# Patient Record
Sex: Male | Born: 1940 | Race: White | Hispanic: No | Smoking: Never smoker
Health system: Southern US, Community
[De-identification: ages and names within clinical notes are randomized; demographics above are authoritative.]

## PROBLEM LIST (undated history)

## (undated) DIAGNOSIS — K5790 Diverticulosis of intestine, part unspecified, without perforation or abscess without bleeding: Secondary | ICD-10-CM

## (undated) DIAGNOSIS — K209 Esophagitis, unspecified without bleeding: Secondary | ICD-10-CM

## (undated) DIAGNOSIS — Z860100 Personal history of colon polyps, unspecified: Secondary | ICD-10-CM

## (undated) DIAGNOSIS — C439 Malignant melanoma of skin, unspecified: Secondary | ICD-10-CM

## (undated) DIAGNOSIS — E0590 Thyrotoxicosis, unspecified without thyrotoxic crisis or storm: Secondary | ICD-10-CM

## (undated) DIAGNOSIS — I10 Essential (primary) hypertension: Secondary | ICD-10-CM

## (undated) DIAGNOSIS — E039 Hypothyroidism, unspecified: Secondary | ICD-10-CM

## (undated) DIAGNOSIS — D126 Benign neoplasm of colon, unspecified: Secondary | ICD-10-CM

## (undated) DIAGNOSIS — K210 Gastro-esophageal reflux disease with esophagitis: Secondary | ICD-10-CM

## (undated) HISTORY — PX: COLON SURGERY: SHX602

## (undated) HISTORY — PX: NASAL/SINUS ENDOSCOPY: SHX288

## (undated) HISTORY — PX: OTHER SURGICAL HISTORY: SHX169

---

## 2004-12-13 ENCOUNTER — Ambulatory Visit: Payer: Self-pay | Admitting: Internal Medicine

## 2004-12-17 ENCOUNTER — Ambulatory Visit: Payer: Self-pay | Admitting: Oncology

## 2005-01-03 ENCOUNTER — Ambulatory Visit: Payer: Self-pay | Admitting: Oncology

## 2005-01-06 ENCOUNTER — Ambulatory Visit: Payer: Self-pay | Admitting: Oncology

## 2005-01-31 ENCOUNTER — Ambulatory Visit: Payer: Self-pay | Admitting: Oncology

## 2005-04-13 ENCOUNTER — Ambulatory Visit: Payer: Self-pay | Admitting: Otolaryngology

## 2006-10-09 ENCOUNTER — Ambulatory Visit: Payer: Self-pay | Admitting: Ophthalmology

## 2006-10-18 ENCOUNTER — Ambulatory Visit: Payer: Self-pay | Admitting: Ophthalmology

## 2007-02-13 ENCOUNTER — Ambulatory Visit: Payer: Self-pay | Admitting: Internal Medicine

## 2007-02-20 ENCOUNTER — Ambulatory Visit: Payer: Self-pay

## 2007-03-28 ENCOUNTER — Ambulatory Visit: Payer: Self-pay | Admitting: Unknown Physician Specialty

## 2007-03-28 HISTORY — PX: ESOPHAGOGASTRODUODENOSCOPY: SHX1529

## 2008-01-30 IMAGING — US US THYROID
1 series · 17 of 23 positions shown · non-contrast
Comparison: none

REASON FOR EXAM: goiter
COMMENTS:

[Series 1: us thyroid · 17 of 23 slices shown]
[im 1/23]
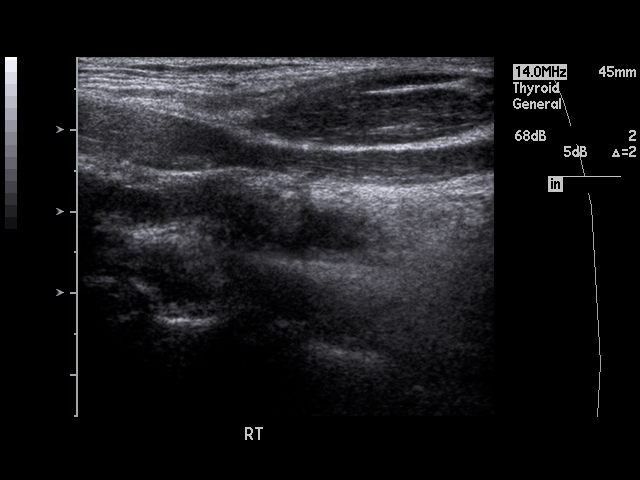
[im 3/23]
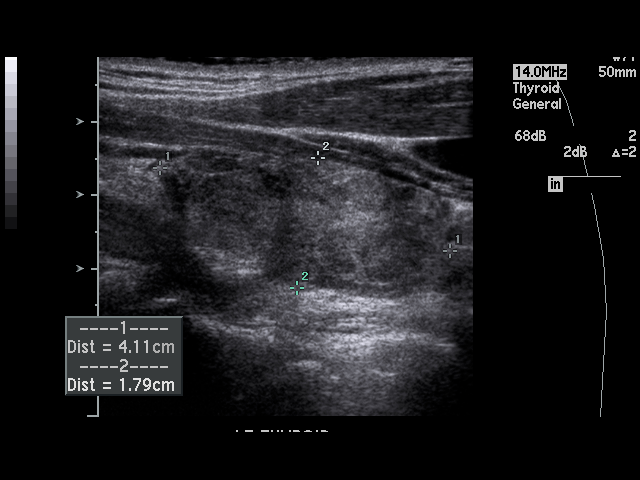
[im 4/23]
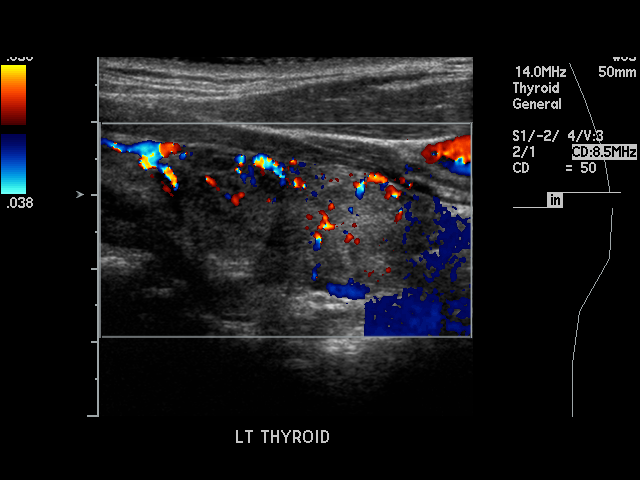
[im 5/23]
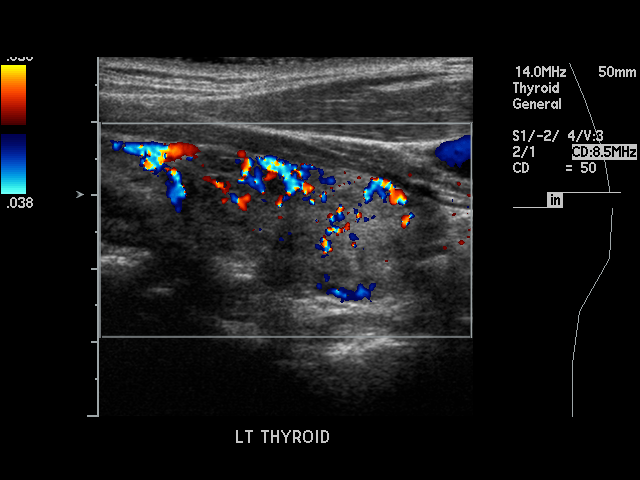
[im 7/23]
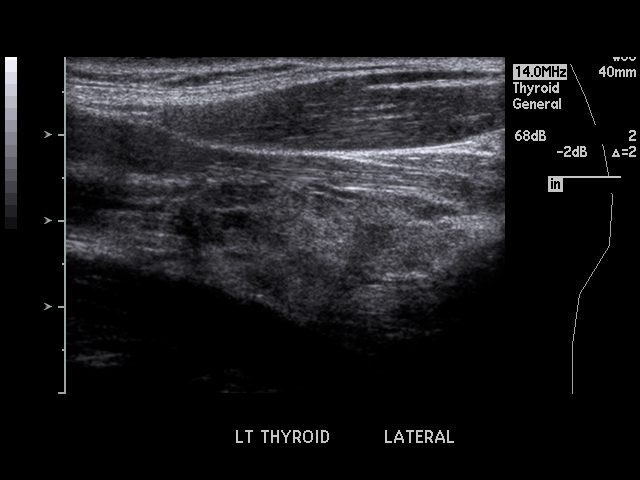
[im 8/23]
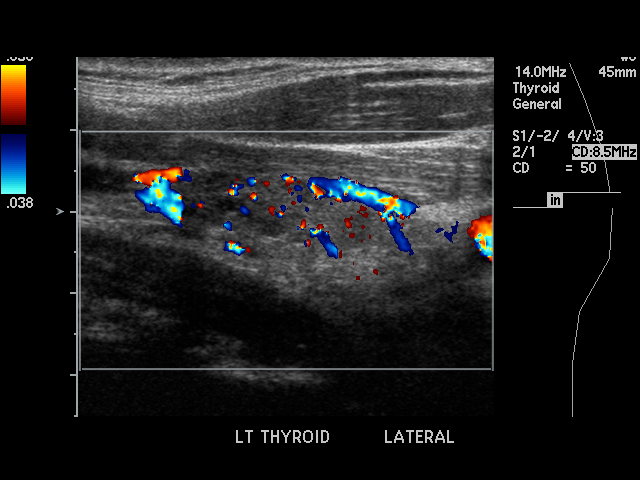
[im 9/23]
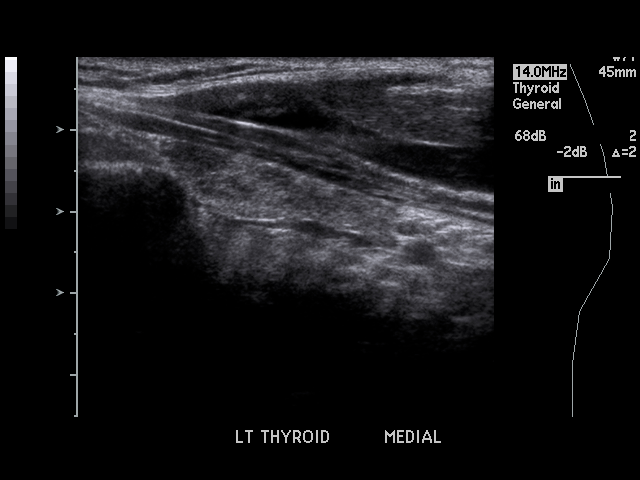
[im 11/23]
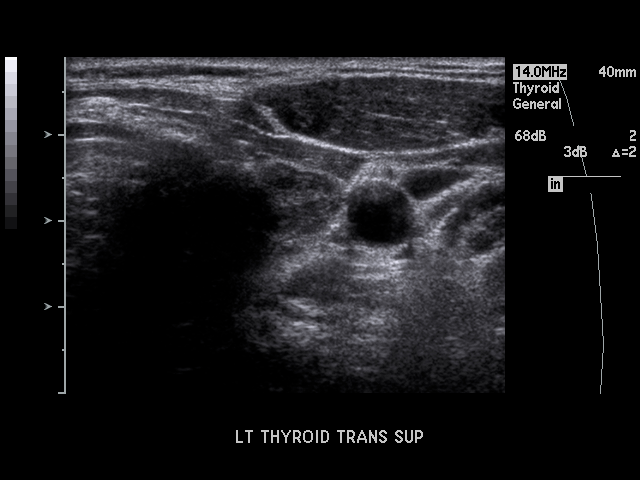
[im 12/23]
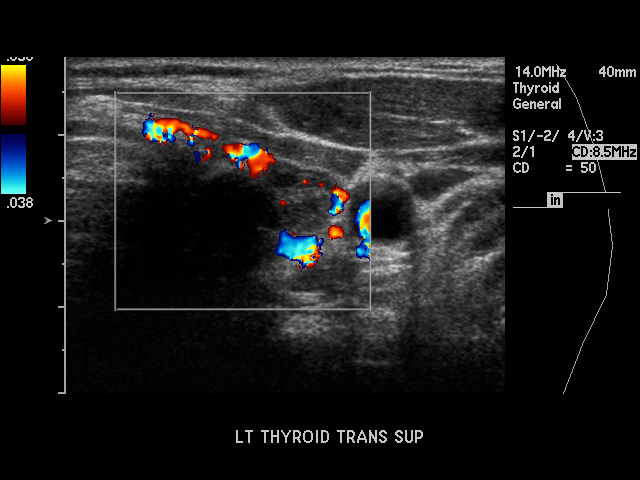
[im 13/23]
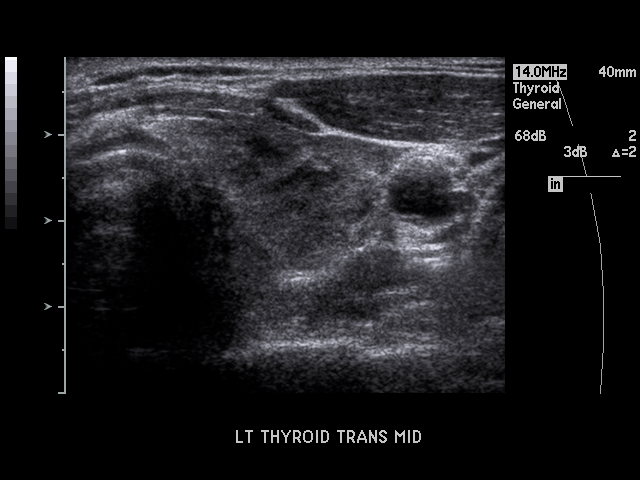
[im 15/23]
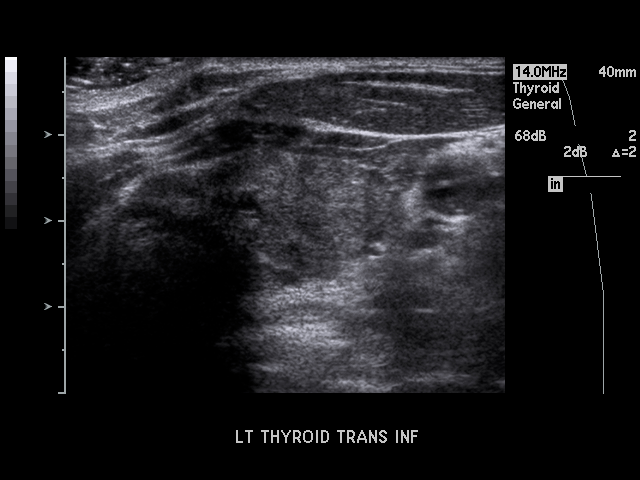
[im 16/23]
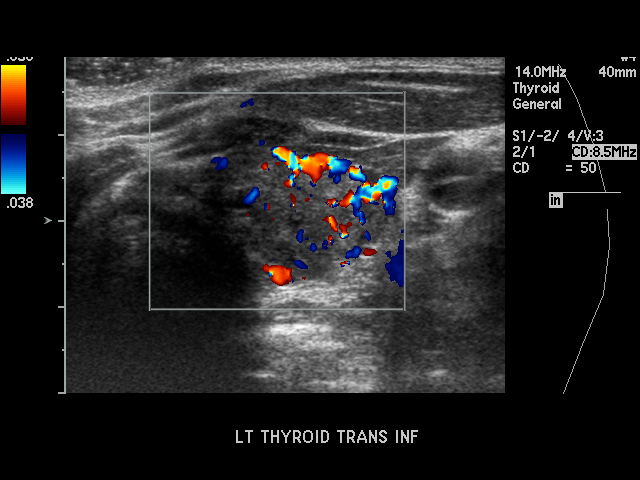
[im 17/23]
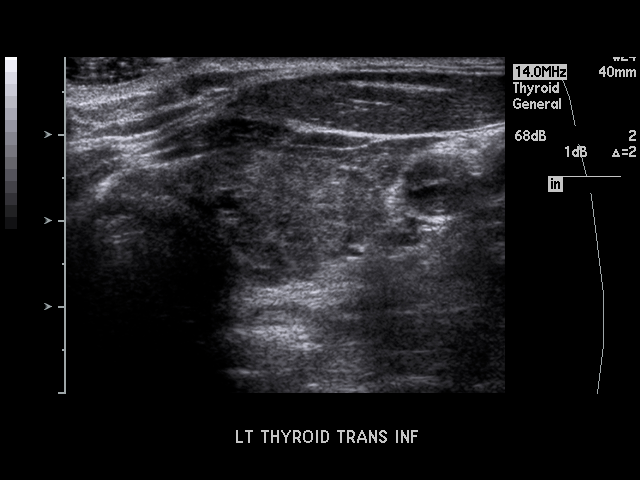
[im 19/23]
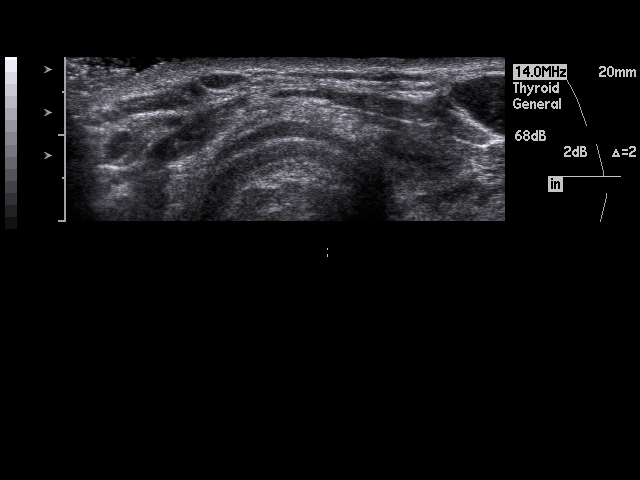
[im 20/23]
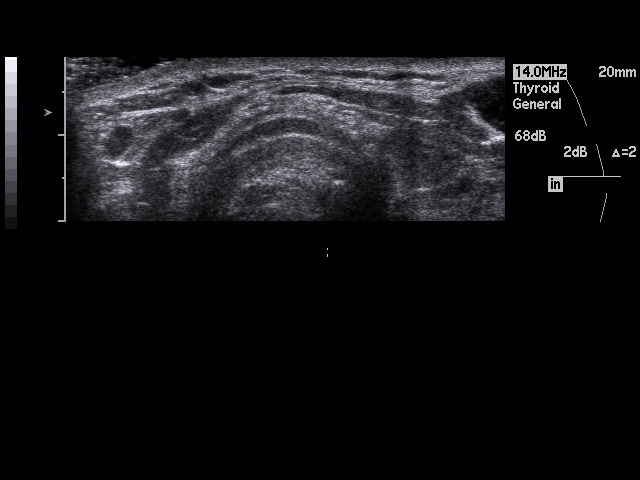
[im 21/23]
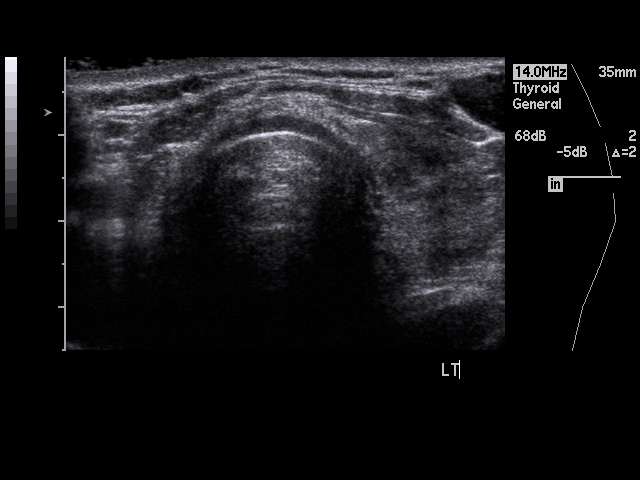
[im 23/23]
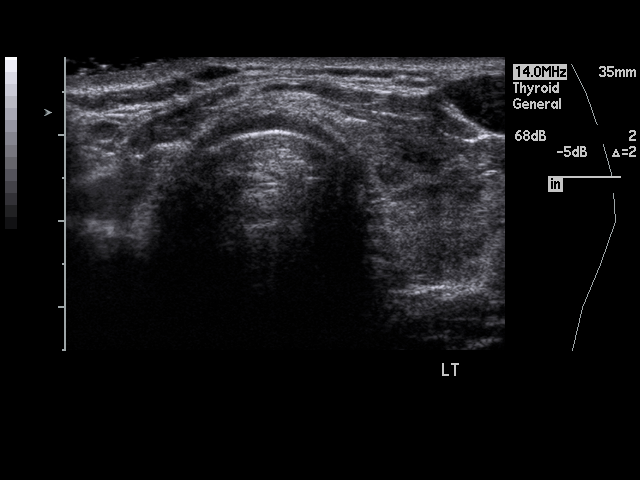

[17 of 23 positions shown; findings below may reference images not displayed]

PROCEDURE:     US  - US THYROID  - February 13, 2007 [DATE]

RESULT:     The RIGHT thyroid lobe is not visualized compatible with prior
surgical removal. The LEFT lobe measures 4.11 cm x 1.79 cm x 2.07 cm. No
solid or cystic mass lesions are seen in the LEFT lobe. There is a
homogeneous echotexture to the LEFT lobe and isthmus.
IMPRESSION: 1. The LEFT lobe of the thyroid is normal appearance sonographically.
2. The RIGHT thyroid lobe is not seen compatible with prior surgical removal.

## 2010-06-02 ENCOUNTER — Ambulatory Visit: Payer: Self-pay | Admitting: Unknown Physician Specialty

## 2014-10-09 DIAGNOSIS — E039 Hypothyroidism, unspecified: Secondary | ICD-10-CM

## 2014-10-09 DIAGNOSIS — K2100 Gastro-esophageal reflux disease with esophagitis, without bleeding: Secondary | ICD-10-CM

## 2014-10-09 HISTORY — DX: Hypothyroidism, unspecified: E03.9

## 2014-10-09 HISTORY — DX: Gastro-esophageal reflux disease with esophagitis, without bleeding: K21.00

## 2015-04-24 ENCOUNTER — Encounter: Payer: Self-pay | Admitting: *Deleted

## 2015-04-27 ENCOUNTER — Ambulatory Visit: Payer: Medicare Other | Admitting: Anesthesiology

## 2015-04-27 ENCOUNTER — Encounter
Admission: RE | Disposition: A | Discharge status: Home / Self Care | Payer: Self-pay | Source: Ambulatory Visit | Attending: Unknown Physician Specialty

## 2015-04-27 ENCOUNTER — Encounter: Payer: Self-pay | Admitting: *Deleted

## 2015-04-27 ENCOUNTER — Ambulatory Visit
Admission: RE | Admit: 2015-04-27 | Discharge: 2015-04-27 | Disposition: A | Discharge status: Home / Self Care | Payer: Medicare Other | Source: Ambulatory Visit | Attending: Unknown Physician Specialty | Admitting: Unknown Physician Specialty

## 2015-04-27 DIAGNOSIS — E039 Hypothyroidism, unspecified: Secondary | ICD-10-CM | POA: Insufficient documentation

## 2015-04-27 DIAGNOSIS — K5730 Diverticulosis of large intestine without perforation or abscess without bleeding: Secondary | ICD-10-CM | POA: Diagnosis not present

## 2015-04-27 DIAGNOSIS — K648 Other hemorrhoids: Secondary | ICD-10-CM | POA: Insufficient documentation

## 2015-04-27 DIAGNOSIS — K210 Gastro-esophageal reflux disease with esophagitis: Secondary | ICD-10-CM | POA: Insufficient documentation

## 2015-04-27 DIAGNOSIS — D122 Benign neoplasm of ascending colon: Secondary | ICD-10-CM | POA: Diagnosis present

## 2015-04-27 DIAGNOSIS — I10 Essential (primary) hypertension: Secondary | ICD-10-CM | POA: Diagnosis not present

## 2015-04-27 DIAGNOSIS — D123 Benign neoplasm of transverse colon: Secondary | ICD-10-CM | POA: Insufficient documentation

## 2015-04-27 HISTORY — DX: Essential (primary) hypertension: I10

## 2015-04-27 HISTORY — DX: Gastro-esophageal reflux disease with esophagitis: K21.0

## 2015-04-27 HISTORY — DX: Malignant melanoma of skin, unspecified: C43.9

## 2015-04-27 HISTORY — PX: COLONOSCOPY: SHX174

## 2015-04-27 HISTORY — PX: COLONOSCOPY: SHX5424

## 2015-04-27 HISTORY — DX: Hypothyroidism, unspecified: E03.9

## 2015-04-27 SURGERY — COLONOSCOPY
Anesthesia: General

## 2015-04-27 MED ORDER — FENTANYL CITRATE (PF) 100 MCG/2ML IJ SOLN
INTRAMUSCULAR | Status: DC | PRN
  Administered 2015-04-27: 50 ug via INTRAVENOUS

## 2015-04-27 MED ORDER — MIDAZOLAM HCL 2 MG/2ML IJ SOLN
INTRAMUSCULAR | Status: DC | PRN
  Administered 2015-04-27: 1 mg via INTRAVENOUS

## 2015-04-27 MED ORDER — SODIUM CHLORIDE 0.9 % IV SOLN: INTRAVENOUS | Status: DC

## 2015-04-27 MED ORDER — PROPOFOL INFUSION 10 MG/ML OPTIME
INTRAVENOUS | Status: DC | PRN
  Administered 2015-04-27: 120 ug/kg/min via INTRAVENOUS

## 2015-04-27 MED ORDER — LACTATED RINGERS IV SOLN
INTRAVENOUS | Status: DC
  Administered 2015-04-27: 13:00:00 via INTRAVENOUS

## 2015-04-27 NOTE — H&P (Signed)
Primary Care Physician:  Rusty Aus., MD Primary Gastroenterologist:  Dr. Vira Agar  Pre-Procedure History & Physical: HPI:  Peter Rosario is a 74 y.o. male is here for an colonoscopy.   Past Medical History  Diagnosis Date  . Hypertension   . Hypothyroidism   . Esophagitis, reflux   . Melanoma     Past Surgical History  Procedure Laterality Date  . Nasal/sinus endoscopy    . Colon surgery    . Goiter      LARGE THYROID GOITER REMOVED    Prior to Admission medications   Medication Sig Start Date End Date Taking? Authorizing Provider  albuterol (PROVENTIL HFA;VENTOLIN HFA) 108 (90 BASE) MCG/ACT inhaler Inhale 2 puffs into the lungs every 6 (six) hours as needed for wheezing or shortness of breath.   Yes Historical Provider, MD  aspirin 81 MG tablet Take 81 mg by mouth daily.   Yes Historical Provider, MD  levothyroxine (SYNTHROID, LEVOTHROID) 137 MCG tablet Take 137 mcg by mouth daily before breakfast.   Yes Historical Provider, MD  levothyroxine (SYNTHROID, LEVOTHROID) 150 MCG tablet Take 150 mcg by mouth daily before breakfast.   Yes Historical Provider, MD  lisinopril-hydrochlorothiazide (PRINZIDE,ZESTORETIC) 20-12.5 MG per tablet Take 1 tablet by mouth daily.   Yes Historical Provider, MD  Multiple Vitamin (MULTIVITAMIN) tablet Take 1 tablet by mouth daily.   Yes Historical Provider, MD  pantoprazole (PROTONIX) 40 MG tablet Take 40 mg by mouth daily.   Yes Historical Provider, MD  vitamin C (ASCORBIC ACID) 500 MG tablet Take 500 mg by mouth 2 (two) times daily.   Yes Historical Provider, MD    Allergies as of 01/20/2015  . (Not on File)    History reviewed. No pertinent family history.  History   Social History  . Marital Status: Married    Spouse Name: N/A  . Number of Children: N/A  . Years of Education: N/A   Occupational History  . Not on file.   Social History Main Topics  . Smoking status: Never Smoker   . Smokeless tobacco: Never Used  .  Alcohol Use: 0.6 oz/week    1 Cans of beer per week  . Drug Use: No  . Sexual Activity: Not on file   Other Topics Concern  . Not on file   Social History Narrative    Review of Systems: See HPI, otherwise negative ROS  Physical Exam: BP 109/71 mmHg  Pulse 62  Temp(Src) 96.8 F (36 C) (Tympanic)  Resp 14  Ht 5\' 8"  (1.727 m)  Wt 74.844 kg (165 lb)  BMI 25.09 kg/m2  SpO2 98% General:   Alert,  pleasant and cooperative in NAD Head:  Normocephalic and atraumatic. Neck:  Supple; no masses or thyromegaly. Lungs:  Clear throughout to auscultation.    Heart:  Regular rate and rhythm. Abdomen:  Soft, nontender and nondistended. Normal bowel sounds, without guarding, and without rebound.   Neurologic:  Alert and  oriented x4;  grossly normal neurologically.  Impression/Plan: Peter Rosario is here for an colonoscopy to be performed for Personal history of colon polyps  Risks, benefits, limitations, and alternatives regarding  colonoscopy have been reviewed with the patient.  Questions have been answered.  All parties agreeable.   Gaylyn Cheers, MD  04/27/2015, 1:18 PM   Primary Care Physician:  Rusty Aus., MD Primary Gastroenterologist:  Dr. Vira Agar  Pre-Procedure History & Physical: HPI:  Peter Rosario is a 74 y.o. male is here for an colonoscopy.  Past Medical History  Diagnosis Date  . Hypertension   . Hypothyroidism   . Esophagitis, reflux   . Melanoma     Past Surgical History  Procedure Laterality Date  . Nasal/sinus endoscopy    . Colon surgery    . Goiter      LARGE THYROID GOITER REMOVED    Prior to Admission medications   Medication Sig Start Date End Date Taking? Authorizing Provider  albuterol (PROVENTIL HFA;VENTOLIN HFA) 108 (90 BASE) MCG/ACT inhaler Inhale 2 puffs into the lungs every 6 (six) hours as needed for wheezing or shortness of breath.   Yes Historical Provider, MD  aspirin 81 MG tablet Take 81 mg by mouth daily.   Yes  Historical Provider, MD  levothyroxine (SYNTHROID, LEVOTHROID) 137 MCG tablet Take 137 mcg by mouth daily before breakfast.   Yes Historical Provider, MD  levothyroxine (SYNTHROID, LEVOTHROID) 150 MCG tablet Take 150 mcg by mouth daily before breakfast.   Yes Historical Provider, MD  lisinopril-hydrochlorothiazide (PRINZIDE,ZESTORETIC) 20-12.5 MG per tablet Take 1 tablet by mouth daily.   Yes Historical Provider, MD  Multiple Vitamin (MULTIVITAMIN) tablet Take 1 tablet by mouth daily.   Yes Historical Provider, MD  pantoprazole (PROTONIX) 40 MG tablet Take 40 mg by mouth daily.   Yes Historical Provider, MD  vitamin C (ASCORBIC ACID) 500 MG tablet Take 500 mg by mouth 2 (two) times daily.   Yes Historical Provider, MD    Allergies as of 01/20/2015  . (Not on File)    History reviewed. No pertinent family history.  History   Social History  . Marital Status: Married    Spouse Name: N/A  . Number of Children: N/A  . Years of Education: N/A   Occupational History  . Not on file.   Social History Main Topics  . Smoking status: Never Smoker   . Smokeless tobacco: Never Used  . Alcohol Use: 0.6 oz/week    1 Cans of beer per week  . Drug Use: No  . Sexual Activity: Not on file   Other Topics Concern  . Not on file   Social History Narrative    Review of Systems: See HPI, otherwise negative ROS  Physical Exam: BP 109/71 mmHg  Pulse 62  Temp(Src) 96.8 F (36 C) (Tympanic)  Resp 14  Ht 5\' 8"  (1.727 m)  Wt 74.844 kg (165 lb)  BMI 25.09 kg/m2  SpO2 98% General:   Alert,  pleasant and cooperative in NAD Head:  Normocephalic and atraumatic. Neck:  Supple; no masses or thyromegaly. Lungs:  Clear throughout to auscultation.    Heart:  Regular rate and rhythm. Abdomen:  Soft, nontender and nondistended. Normal bowel sounds, without guarding, and without rebound.   Neurologic:  Alert and  oriented x4;  grossly normal neurologically.  Impression/Plan: Peter Rosario is  here for an colonoscopy to be performed for personal history of colon polyps  Risks, benefits, limitations, and alternatives regarding  colonoscopy have been reviewed with the patient.  Questions have been answered.  All parties agreeable.   Gaylyn Cheers, MD  04/27/2015, 1:18 PM

## 2015-04-27 NOTE — Anesthesia Postprocedure Evaluation (Signed)
  Anesthesia Post-op Note  Patient: Peter Rosario  Procedure(s) Performed: Procedure(s): COLONOSCOPY (N/A)  Anesthesia type:General  Patient location: PACU  Post pain: Pain level controlled  Post assessment: Post-op Vital signs reviewed, Patient's Cardiovascular Status Stable, Respiratory Function Stable, Patent Airway and No signs of Nausea or vomiting  Post vital signs: Reviewed and stable  Last Vitals:  Filed Vitals:   04/27/15 1411  BP: 117/74  Pulse: 67  Temp:   Resp: 16    Level of consciousness: awake, alert  and patient cooperative  Complications: No apparent anesthesia complications

## 2015-04-27 NOTE — Anesthesia Preprocedure Evaluation (Signed)
Anesthesia Evaluation  Patient identified by MRN, date of birth, ID band Patient awake    Reviewed: Allergy & Precautions, NPO status , Patient's Chart, lab work & pertinent test results  History of Anesthesia Complications Negative for: history of anesthetic complications  Airway Mallampati: II  TM Distance: >3 FB Neck ROM: Full    Dental  (+) Teeth Intact   Pulmonary          Cardiovascular hypertension, Pt. on medications     Neuro/Psych    GI/Hepatic GERD-  Medicated and Controlled,  Endo/Other  Hypothyroidism   Renal/GU      Musculoskeletal   Abdominal   Peds  Hematology   Anesthesia Other Findings   Reproductive/Obstetrics                             Anesthesia Physical Anesthesia Plan  ASA: II  Anesthesia Plan: General   Post-op Pain Management:    Induction: Intravenous  Airway Management Planned: Nasal Cannula  Additional Equipment:   Intra-op Plan:   Post-operative Plan:   Informed Consent: I have reviewed the patients History and Physical, chart, labs and discussed the procedure including the risks, benefits and alternatives for the proposed anesthesia with the patient or authorized representative who has indicated his/her understanding and acceptance.     Plan Discussed with:   Anesthesia Plan Comments:         Anesthesia Quick Evaluation

## 2015-04-27 NOTE — Transfer of Care (Signed)
Immediate Anesthesia Transfer of Care Note  Patient: Peter Rosario  Procedure(s) Performed: Procedure(s): COLONOSCOPY (N/A)  Patient Location: PACU  Anesthesia Type:General  Level of Consciousness: awake and sedated  Airway & Oxygen Therapy: Patient Spontanous Breathing and Patient connected to nasal cannula oxygen  Post-op Assessment: Report given to RN and Post -op Vital signs reviewed and stable  Post vital signs: Reviewed and stable  Last Vitals:  Filed Vitals:   04/27/15 1257  BP: 109/71  Pulse: 62  Temp: 36 C  Resp: 14    Complications: No apparent anesthesia complications

## 2015-04-27 NOTE — Op Note (Addendum)
[facility] Gastroenterology Patient Name: Peter Rosario Procedure Date: 04/27/2015 12:45 PM MRN: [id number] Account #: [id number] Date of Birth: 1941-05-25 Admit Type: Outpatient Age: 74 Room: [facility] ENDO ROOM 1 Gender: Male Note Status: Finalized Procedure:         Colonoscopy Indications:       Personal history of colonic polyps Providers:         Manya Silvas, MD Referring MD:      Rusty Aus, MD (Referring MD) Medicines:         Propofol per Anesthesia Complications:     No immediate complications. Procedure:         Pre-Anesthesia Assessment:                    - After reviewing the risks and benefits, the patient was                     deemed in satisfactory condition to undergo the procedure.                    - After reviewing the risks and benefits, the patient was                     deemed in satisfactory condition to undergo the procedure.                    After obtaining informed consent, the colonoscope was                     passed under direct vision. Throughout the procedure, the                     patient's blood pressure, pulse, and oxygen saturations                     were monitored continuously. The Colonoscope was                     introduced through the anus and advanced to the the cecum,                     identified by appendiceal orifice and ileocecal valve. The                     patient tolerated the procedure well. The quality of the                     bowel preparation was good. Findings:      Two sessile polyps were found at the hepatic flexure and in the       ascending colon. The polyps were diminutive in size. These polyps were       removed with a jumbo cold forceps. Resection and retrieval were complete.      A small polyp was found in the ascending colon. The polyp was sessile.       The polyp was removed with a cold snare. Resection and retrieval were       complete.      Many small-mouthed  diverticula were found in the sigmoid colon.      Internal hemorrhoids were found during endoscopy. The hemorrhoids were       small and Grade I (internal hemorrhoids that do not prolapse).      The exam was otherwise without  abnormality. Impression:        - Two diminutive polyps at the hepatic flexure and in the                     ascending colon. Resected and retrieved.                    - One small polyp in the ascending colon. Resected and                     retrieved.                    - Diverticulosis in the sigmoid colon.                    - Internal hemorrhoids.                    - The examination was otherwise normal. Recommendation:    - The findings and recommendations were discussed with the                     patient's family. Manya Silvas, MD 04/27/2015 1:48:38 PM This report has been signed electronically. Number of Addenda: 0 Note Initiated On: 04/27/2015 12:45 PM Scope Withdrawal Time: 0 hours 8 minutes 41 seconds  Total Procedure Duration: 0 hours 18 minutes 41 seconds       [facility]

## 2015-04-27 NOTE — Anesthesia Procedure Notes (Signed)
Performed by: COOK-MARTIN, Leyli Kevorkian Pre-anesthesia Checklist: Patient identified, Emergency Drugs available, Suction available, Patient being monitored and Timeout performed Patient Re-evaluated:Patient Re-evaluated prior to inductionOxygen Delivery Method: Nasal cannula Preoxygenation: Pre-oxygenation with 100% oxygen Intubation Type: IV induction Placement Confirmation: positive ETCO2 and CO2 detector       

## 2015-04-28 ENCOUNTER — Encounter: Payer: Self-pay | Admitting: Unknown Physician Specialty

## 2015-04-29 LAB — SURGICAL PATHOLOGY

## 2018-06-19 ENCOUNTER — Encounter: Payer: Self-pay | Admitting: *Deleted

## 2018-06-20 ENCOUNTER — Ambulatory Visit: Payer: Medicare Other | Admitting: Anesthesiology

## 2018-06-20 ENCOUNTER — Encounter: Payer: Self-pay | Admitting: Anesthesiology

## 2018-06-20 ENCOUNTER — Encounter
Admission: RE | Disposition: A | Discharge status: Home / Self Care | Payer: Self-pay | Source: Ambulatory Visit | Attending: Unknown Physician Specialty

## 2018-06-20 ENCOUNTER — Ambulatory Visit
Admission: RE | Admit: 2018-06-20 | Discharge: 2018-06-20 | Disposition: A | Discharge status: Home / Self Care | Payer: Medicare Other | Source: Ambulatory Visit | Attending: Unknown Physician Specialty | Admitting: Unknown Physician Specialty

## 2018-06-20 DIAGNOSIS — Z85820 Personal history of malignant melanoma of skin: Secondary | ICD-10-CM | POA: Insufficient documentation

## 2018-06-20 DIAGNOSIS — D122 Benign neoplasm of ascending colon: Secondary | ICD-10-CM | POA: Insufficient documentation

## 2018-06-20 DIAGNOSIS — Z79899 Other long term (current) drug therapy: Secondary | ICD-10-CM | POA: Diagnosis not present

## 2018-06-20 DIAGNOSIS — E0590 Thyrotoxicosis, unspecified without thyrotoxic crisis or storm: Secondary | ICD-10-CM | POA: Insufficient documentation

## 2018-06-20 DIAGNOSIS — D120 Benign neoplasm of cecum: Secondary | ICD-10-CM | POA: Diagnosis not present

## 2018-06-20 DIAGNOSIS — Z7982 Long term (current) use of aspirin: Secondary | ICD-10-CM | POA: Diagnosis not present

## 2018-06-20 DIAGNOSIS — Z86010 Personal history of colonic polyps: Secondary | ICD-10-CM | POA: Insufficient documentation

## 2018-06-20 DIAGNOSIS — K5730 Diverticulosis of large intestine without perforation or abscess without bleeding: Secondary | ICD-10-CM | POA: Insufficient documentation

## 2018-06-20 DIAGNOSIS — I10 Essential (primary) hypertension: Secondary | ICD-10-CM | POA: Insufficient documentation

## 2018-06-20 DIAGNOSIS — Z1211 Encounter for screening for malignant neoplasm of colon: Secondary | ICD-10-CM | POA: Insufficient documentation

## 2018-06-20 DIAGNOSIS — D125 Benign neoplasm of sigmoid colon: Secondary | ICD-10-CM | POA: Insufficient documentation

## 2018-06-20 HISTORY — DX: Personal history of colon polyps, unspecified: Z86.0100

## 2018-06-20 HISTORY — DX: Benign neoplasm of colon, unspecified: D12.6

## 2018-06-20 HISTORY — DX: Thyrotoxicosis, unspecified without thyrotoxic crisis or storm: E05.90

## 2018-06-20 HISTORY — DX: Esophagitis, unspecified without bleeding: K20.90

## 2018-06-20 HISTORY — DX: Esophagitis, unspecified: K20.9

## 2018-06-20 HISTORY — PX: COLONOSCOPY WITH PROPOFOL: SHX5780

## 2018-06-20 HISTORY — DX: Personal history of colonic polyps: Z86.010

## 2018-06-20 HISTORY — DX: Diverticulosis of intestine, part unspecified, without perforation or abscess without bleeding: K57.90

## 2018-06-20 SURGERY — COLONOSCOPY WITH PROPOFOL
Anesthesia: General

## 2018-06-20 MED ORDER — MIDAZOLAM HCL 2 MG/2ML IJ SOLN
INTRAMUSCULAR | Status: AC
  Filled 2018-06-20: qty 2

## 2018-06-20 MED ORDER — PROPOFOL 500 MG/50ML IV EMUL
INTRAVENOUS | Status: DC | PRN
  Administered 2018-06-20: 50 ug/kg/min via INTRAVENOUS

## 2018-06-20 MED ORDER — FENTANYL CITRATE (PF) 100 MCG/2ML IJ SOLN
INTRAMUSCULAR | Status: AC
  Filled 2018-06-20: qty 2

## 2018-06-20 MED ORDER — PROPOFOL 10 MG/ML IV BOLUS
INTRAVENOUS | Status: DC | PRN
  Administered 2018-06-20: 10 mg via INTRAVENOUS
  Administered 2018-06-20: 20 mg via INTRAVENOUS

## 2018-06-20 MED ORDER — FENTANYL CITRATE (PF) 100 MCG/2ML IJ SOLN
INTRAMUSCULAR | Status: DC | PRN
  Administered 2018-06-20 (×2): 50 ug via INTRAVENOUS

## 2018-06-20 MED ORDER — MIDAZOLAM HCL 5 MG/5ML IJ SOLN
INTRAMUSCULAR | Status: DC | PRN
  Administered 2018-06-20: 2 mg via INTRAVENOUS

## 2018-06-20 MED ORDER — PROPOFOL 500 MG/50ML IV EMUL
INTRAVENOUS | Status: AC
  Filled 2018-06-20: qty 50

## 2018-06-20 MED ORDER — SODIUM CHLORIDE 0.9 % IV SOLN
INTRAVENOUS | Status: DC
  Administered 2018-06-20: 1000 mL via INTRAVENOUS

## 2018-06-20 MED ORDER — LIDOCAINE HCL (PF) 1 % IJ SOLN
INTRAMUSCULAR | Status: AC
  Administered 2018-06-20: 0.3 mL via INTRADERMAL
  Filled 2018-06-20: qty 2

## 2018-06-20 MED ORDER — LIDOCAINE HCL (PF) 1 % IJ SOLN
2.0000 mL | Freq: Once | INTRAMUSCULAR | Status: AC
  Administered 2018-06-20: 0.3 mL via INTRADERMAL

## 2018-06-20 MED ORDER — LIDOCAINE HCL (PF) 2 % IJ SOLN
INTRAMUSCULAR | Status: AC
  Filled 2018-06-20: qty 10

## 2018-06-20 MED ORDER — LIDOCAINE HCL (PF) 2 % IJ SOLN
INTRAMUSCULAR | Status: DC | PRN
  Administered 2018-06-20: 80 mg

## 2018-06-20 MED ORDER — SODIUM CHLORIDE 0.9 % IV SOLN
INTRAVENOUS | Status: DC
  Administered 2018-06-20: 10:00:00 via INTRAVENOUS

## 2018-06-20 NOTE — H&P (Signed)
Primary Care Physician:  Rusty Aus, MD Primary Gastroenterologist:  Dr. Vira Agar  Pre-Procedure History & Physical: HPI:  Peter Jafri. is a 77 y.o. male is here for an colonoscopy.  Done for personal history of colon polyp.   Past Medical History:  Diagnosis Date  . Benign neoplasm of colon   . Diverticulosis   . Esophagitis   . Esophagitis, reflux 10/09/2014  . History of colon polyps   . Hypertension   . Hyperthyroidism   . Hypothyroidism 10/09/2014  . Melanoma ([place])   . Melanoma ([place])    right arm     Past Surgical History:  Procedure Laterality Date  . COLON SURGERY    . COLONOSCOPY N/A 04/27/2015   Procedure: COLONOSCOPY;  Surgeon: Manya Silvas, MD;  Location: [facility] ENDOSCOPY;  Service: Endoscopy;  Laterality: N/A;  . COLONOSCOPY  04/27/2015  . Diverticular/SBO surgery    . ESOPHAGOGASTRODUODENOSCOPY  03/28/2007  . GOITER     LARGE THYROID GOITER REMOVED  . NASAL/SINUS ENDOSCOPY      Prior to Admission medications   Medication Sig Start Date End Date Taking? Authorizing Provider  Misc Natural Products (GLUCOSAMINE CHOND COMPLEX/MSM PO) Take by mouth daily.   Yes [provider]  thiamine 250 MG tablet Take 250 mg by mouth daily.   Yes [provider]  albuterol (PROVENTIL HFA;VENTOLIN HFA) 108 (90 BASE) MCG/ACT inhaler Inhale 2 puffs into the lungs every 6 (six) hours as needed for wheezing or shortness of breath.    [provider]  aspirin 81 MG tablet Take 81 mg by mouth daily.    [provider]  levothyroxine (SYNTHROID, LEVOTHROID) 137 MCG tablet Take 137 mcg by mouth daily before breakfast.    [provider]  levothyroxine (SYNTHROID, LEVOTHROID) 150 MCG tablet Take 150 mcg by mouth daily before breakfast.    [provider]  lisinopril-hydrochlorothiazide (PRINZIDE,ZESTORETIC) 20-12.5 MG per tablet Take 1 tablet by mouth daily.    [provider]  Multiple Vitamin (MULTIVITAMIN)  tablet Take 1 tablet by mouth daily.    [provider]  pantoprazole (PROTONIX) 40 MG tablet Take 40 mg by mouth daily.    [provider]  vitamin C (ASCORBIC ACID) 500 MG tablet Take 500 mg by mouth 2 (two) times daily.    [provider]    Allergies as of 05/07/2018  . (Not on File)    History reviewed. No pertinent family history.  Social History   Socioeconomic History  . Marital status: Married    Spouse name: Not on file  . Number of children: Not on file  . Years of education: Not on file  . Highest education level: Not on file  Occupational History  . Not on file  Social Needs  . Financial resource strain: Not on file  . Food insecurity:    Worry: Not on file    Inability: Not on file  . Transportation needs:    Medical: Not on file    Non-medical: Not on file  Tobacco Use  . Smoking status: Never Smoker  . Smokeless tobacco: Never Used  Substance and Sexual Activity  . Alcohol use: Yes    Alcohol/week: 1.0 standard drinks    Types: 1 Cans of beer per week    Comment: Occassional/ Social   . Drug use: No  . Sexual activity: Not on file  Lifestyle  . Physical activity:    Days per week: Not on file  Minutes per session: Not on file  . Stress: Not on file  Relationships  . Social connections:    Talks on phone: Not on file    Gets together: Not on file    Attends religious service: Not on file    Active member of club or organization: Not on file    Attends meetings of clubs or organizations: Not on file    Relationship status: Not on file  . Intimate partner violence:    Fear of current or ex partner: Not on file    Emotionally abused: Not on file    Physically abused: Not on file    Forced sexual activity: Not on file  Other Topics Concern  . Not on file  Social History Narrative  . Not on file    Review of Systems: See HPI, otherwise negative ROS  Physical Exam: There were no vitals taken for this  visit. General:   Alert,  pleasant and cooperative in NAD Head:  Normocephalic and atraumatic. Neck:  Supple; no masses or thyromegaly. Lungs:  Clear throughout to auscultation.    Heart:  Regular rate and rhythm. Abdomen:  Soft, nontender and nondistended. Normal bowel sounds, without guarding, and without rebound.   Neurologic:  Alert and  oriented x4;  grossly normal neurologically.  Impression/Plan: Peri [place]. is here for an colonoscopy to be performed for Persona history of colon polyps.  Risks, benefits, limitations, and alternatives regarding  colonoscopy have been reviewed with the patient.  Questions have been answered.  All parties agreeable.   Peter Cheers, MD  06/20/2018, 10:11 AM

## 2018-06-20 NOTE — Transfer of Care (Signed)
Immediate Anesthesia Transfer of Care Note  Patient: Peter Rosario.  Procedure(s) Performed: COLONOSCOPY WITH PROPOFOL (N/A )  Patient Location: PACU  Anesthesia Type:General  Level of Consciousness: sedated  Airway & Oxygen Therapy: Patient Spontanous Breathing and Patient connected to nasal cannula oxygen  Post-op Assessment: Report given to RN and Post -op Vital signs reviewed and stable  Post vital signs: Reviewed and stable  Last Vitals:  Vitals Value Taken Time  BP    Temp    Pulse 55 06/20/2018 10:55 AM  Resp 12 06/20/2018 10:55 AM  SpO2 98 % 06/20/2018 10:55 AM  Vitals shown include unvalidated device data.  Last Pain:  Vitals:   06/20/18 0959  TempSrc: Tympanic  PainSc: 0-No pain         Complications: No apparent anesthesia complications

## 2018-06-20 NOTE — Anesthesia Postprocedure Evaluation (Signed)
Anesthesia Post Note  Patient: Peter Rosario.  Procedure(s) Performed: COLONOSCOPY WITH PROPOFOL (N/A )  Patient location during evaluation: Endoscopy Anesthesia Type: General Level of consciousness: awake and alert Pain management: pain level controlled Vital Signs Assessment: post-procedure vital signs reviewed and stable Respiratory status: spontaneous breathing, nonlabored ventilation, respiratory function stable and patient connected to nasal cannula oxygen Cardiovascular status: blood pressure returned to baseline and stable Postop Assessment: no apparent nausea or vomiting Anesthetic complications: no     Last Vitals:  Vitals:   06/20/18 1056 06/20/18 1057  BP: 105/61 116/69  Pulse:    Resp:    Temp:    SpO2:      Last Pain:  Vitals:   06/20/18 1132  TempSrc:   PainSc: 0-No pain                 [place]

## 2018-06-20 NOTE — Anesthesia Preprocedure Evaluation (Signed)
Anesthesia Evaluation  Patient identified by MRN, date of birth, ID band Patient awake    Reviewed: Allergy & Precautions, NPO status , Patient's Chart, lab work & pertinent test results, reviewed documented beta blocker date and time   Airway Mallampati: II  TM Distance: >3 FB     Dental  (+) Chipped   Pulmonary           Cardiovascular hypertension, Pt. on medications      Neuro/Psych    GI/Hepatic   Endo/Other  Hypothyroidism   Renal/GU      Musculoskeletal   Abdominal   Peds  Hematology   Anesthesia Other Findings   Reproductive/Obstetrics                             Anesthesia Physical Anesthesia Plan  ASA: II  Anesthesia Plan: General   Post-op Pain Management:    Induction: Intravenous  PONV Risk Score and Plan:   Airway Management Planned:   Additional Equipment:   Intra-op Plan:   Post-operative Plan:   Informed Consent: I have reviewed the patients History and Physical, chart, labs and discussed the procedure including the risks, benefits and alternatives for the proposed anesthesia with the patient or authorized representative who has indicated his/her understanding and acceptance.     Plan Discussed with: CRNA  Anesthesia Plan Comments:         Anesthesia Quick Evaluation

## 2018-06-20 NOTE — Anesthesia Post-op Follow-up Note (Signed)
Anesthesia QCDR form completed.        

## 2018-06-20 NOTE — Op Note (Signed)
[facility] Gastroenterology Patient Name: Peter Rosario Procedure Date: 06/20/2018 9:59 AM MRN: [id number] Account #: [id number] Date of Birth: 1941-01-07 Admit Type: Outpatient Age: 77 Room: [facility] ENDO ROOM 3 Gender: Male Note Status: Finalized Procedure:            Colonoscopy Indications:          High risk colon cancer surveillance: Personal history                        of colonic polyps Providers:            Manya Silvas, MD Referring MD:         Rusty Aus, MD (Referring MD) Medicines:            Propofol per Anesthesia Complications:        No immediate complications. Procedure:            Pre-Anesthesia Assessment:                       - After reviewing the risks and benefits, the patient                        was deemed in satisfactory condition to undergo the                        procedure.                       After obtaining informed consent, the colonoscope was                        passed under direct vision. Throughout the procedure,                        the patient's blood pressure, pulse, and oxygen                        saturations were monitored continuously. The                        Colonoscope was introduced through the anus and                        advanced to the the cecum, identified by appendiceal                        orifice and ileocecal valve. The colonoscopy was                        performed without difficulty. The patient tolerated the                        procedure well. The quality of the bowel preparation                        was good. Findings:      Two sessile polyps were found in the ascending colon and cecum. The       polyps were diminutive in size. These polyps were removed with a jumbo       cold forceps. Resection and retrieval were complete.  A diminutive polyp was found in the sigmoid colon. The polyp was       sessile. The polyp was removed with a cold snare. Resection and   retrieval were complete.      A diminutive polyp was found in the recto-sigmoid colon. The polyp was       sessile. It was removed with hot snare but not retrieved.      A few small-mouthed diverticula were found in the sigmoid colon and       descending colon. Impression:           - Two diminutive polyps in the ascending colon and in                        the cecum, removed with a jumbo cold forceps. Resected                        and retrieved.                       - One diminutive polyp in the sigmoid colon, removed                        with a cold snare. Resected and retrieved.                       - One diminutive polyp at the recto-sigmoid colon.                       - Diverticulosis in the sigmoid colon and in the                        descending colon. Recommendation:       - Await pathology results. Manya Silvas, MD 06/20/2018 10:56:48 AM This report has been signed electronically. Number of Addenda: 0 Note Initiated On: 06/20/2018 9:59 AM Scope Withdrawal Time: 0 hours 16 minutes 19 seconds  Total Procedure Duration: 0 hours 23 minutes 10 seconds       [facility]

## 2018-06-23 LAB — SURGICAL PATHOLOGY

## 2022-07-06 ENCOUNTER — Other Ambulatory Visit: Payer: Self-pay

## 2022-07-06 DIAGNOSIS — Z86010 Personal history of colonic polyps: Secondary | ICD-10-CM

## 2022-07-06 DIAGNOSIS — R7989 Other specified abnormal findings of blood chemistry: Secondary | ICD-10-CM

## 2022-07-15 ENCOUNTER — Ambulatory Visit: Payer: Medicare Other

## 2022-07-15 ENCOUNTER — Ambulatory Visit
Admission: RE | Admit: 2022-07-15 | Discharge: 2022-07-15 | Disposition: A | Discharge status: Home / Self Care | Payer: Medicare Other | Source: Ambulatory Visit | Attending: Nurse Practitioner | Admitting: Nurse Practitioner

## 2022-07-15 DIAGNOSIS — R7989 Other specified abnormal findings of blood chemistry: Secondary | ICD-10-CM | POA: Insufficient documentation

## 2022-07-15 DIAGNOSIS — Z86010 Personal history of colonic polyps: Secondary | ICD-10-CM | POA: Insufficient documentation

## 2022-07-20 ENCOUNTER — Other Ambulatory Visit: Payer: Self-pay | Admitting: Nurse Practitioner

## 2022-07-20 DIAGNOSIS — R7989 Other specified abnormal findings of blood chemistry: Secondary | ICD-10-CM

## 2022-07-27 ENCOUNTER — Ambulatory Visit
Admission: RE | Admit: 2022-07-27 | Discharge: 2022-07-27 | Disposition: A | Discharge status: Home / Self Care | Payer: Medicare Other | Source: Ambulatory Visit | Attending: Nurse Practitioner | Admitting: Nurse Practitioner

## 2022-07-27 DIAGNOSIS — R7989 Other specified abnormal findings of blood chemistry: Secondary | ICD-10-CM

## 2022-09-20 ENCOUNTER — Encounter: Payer: Self-pay | Admitting: Internal Medicine

## 2022-09-21 ENCOUNTER — Ambulatory Visit: Payer: Medicare Other | Admitting: Certified Registered"

## 2022-09-21 ENCOUNTER — Encounter: Payer: Self-pay | Admitting: Internal Medicine

## 2022-09-21 ENCOUNTER — Other Ambulatory Visit: Payer: Self-pay

## 2022-09-21 ENCOUNTER — Encounter
Admission: RE | Disposition: A | Discharge status: Home / Self Care | Payer: Self-pay | Source: Home / Self Care | Attending: Internal Medicine

## 2022-09-21 ENCOUNTER — Ambulatory Visit
Admission: RE | Admit: 2022-09-21 | Discharge: 2022-09-21 | Disposition: A | Discharge status: Home / Self Care | Payer: Medicare Other | Attending: Internal Medicine | Admitting: Internal Medicine

## 2022-09-21 DIAGNOSIS — K219 Gastro-esophageal reflux disease without esophagitis: Secondary | ICD-10-CM | POA: Diagnosis not present

## 2022-09-21 DIAGNOSIS — K640 First degree hemorrhoids: Secondary | ICD-10-CM | POA: Insufficient documentation

## 2022-09-21 DIAGNOSIS — Z85820 Personal history of malignant melanoma of skin: Secondary | ICD-10-CM | POA: Diagnosis not present

## 2022-09-21 DIAGNOSIS — E890 Postprocedural hypothyroidism: Secondary | ICD-10-CM | POA: Insufficient documentation

## 2022-09-21 DIAGNOSIS — Z86010 Personal history of colonic polyps: Secondary | ICD-10-CM | POA: Diagnosis not present

## 2022-09-21 DIAGNOSIS — K5730 Diverticulosis of large intestine without perforation or abscess without bleeding: Secondary | ICD-10-CM | POA: Diagnosis not present

## 2022-09-21 DIAGNOSIS — I10 Essential (primary) hypertension: Secondary | ICD-10-CM | POA: Diagnosis not present

## 2022-09-21 DIAGNOSIS — Z09 Encounter for follow-up examination after completed treatment for conditions other than malignant neoplasm: Secondary | ICD-10-CM | POA: Diagnosis present

## 2022-09-21 DIAGNOSIS — Z79899 Other long term (current) drug therapy: Secondary | ICD-10-CM | POA: Diagnosis not present

## 2022-09-21 DIAGNOSIS — D122 Benign neoplasm of ascending colon: Secondary | ICD-10-CM | POA: Diagnosis not present

## 2022-09-21 HISTORY — PX: COLONOSCOPY WITH PROPOFOL: SHX5780

## 2022-09-21 LAB — GLUCOSE, CAPILLARY: Glucose-Capillary: 148 mg/dL — ABNORMAL HIGH (ref 70–99)

## 2022-09-21 SURGERY — COLONOSCOPY WITH PROPOFOL
Anesthesia: General

## 2022-09-21 MED ORDER — PROPOFOL 10 MG/ML IV BOLUS
INTRAVENOUS | Status: DC | PRN
  Administered 2022-09-21: 140 ug/kg/min via INTRAVENOUS
  Administered 2022-09-21: 90 mg via INTRAVENOUS

## 2022-09-21 MED ORDER — SODIUM CHLORIDE 0.9 % IV SOLN: INTRAVENOUS | Status: DC

## 2022-09-21 MED ORDER — LIDOCAINE HCL (CARDIAC) PF 100 MG/5ML IV SOSY
PREFILLED_SYRINGE | INTRAVENOUS | Status: DC | PRN
  Administered 2022-09-21: 100 mg via INTRAVENOUS

## 2022-09-21 MED ORDER — STERILE WATER FOR IRRIGATION IR SOLN
Status: DC | PRN
  Administered 2022-09-21: 100 mL

## 2022-09-21 NOTE — Transfer of Care (Signed)
Immediate Anesthesia Transfer of Care Note  Patient: Junah Yam.  Procedure(s) Performed: COLONOSCOPY WITH PROPOFOL  Patient Location: Endoscopy Unit  Anesthesia Type:General  Level of Consciousness: drowsy  Airway & Oxygen Therapy: Patient Spontanous Breathing  Post-op Assessment: Report given to RN and Post -op Vital signs reviewed and stable  Post vital signs: Reviewed and stable  Last Vitals:  Vitals Value Taken Time  BP 101/64 09/21/22 1307  Temp 35.9 C 09/21/22 1306  Pulse 73 09/21/22 1307  Resp 15 09/21/22 1307  SpO2 96 % 09/21/22 1307  Vitals shown include unvalidated device data.  Last Pain:  Vitals:   09/21/22 1306  TempSrc: Temporal  PainSc: Asleep         Complications: No notable events documented.

## 2022-09-21 NOTE — Op Note (Signed)
[facility] Gastroenterology Patient Name: Peter Rosario Procedure Date: 09/21/2022 12:22 PM MRN: [id number] Account #: [id number] Date of Birth: 1941/03/16 Admit Type: Outpatient Age: 81 Room: [facility] ENDO ROOM 2 Gender: Male Note Status: Finalized Instrument Name: Jasper Riling [id number] Procedure:             Colonoscopy Indications:           High risk colon cancer surveillance: Personal history                         of non-advanced adenoma, High risk colon cancer                         surveillance: Personal history of sessile serrated                         colon polyp (less than 10 mm in size) with no dysplasia Providers:             Benay Pike. Demir Titsworth MD, MD Medicines:             Propofol per Anesthesia Complications:         No immediate complications. Procedure:             Pre-Anesthesia Assessment:                        - The risks and benefits of the procedure and the                         sedation options and risks were discussed with the                         patient. All questions were answered and informed                         consent was obtained.                        - Patient identification and proposed procedure were                         verified prior to the procedure by the nurse. The                         procedure was verified in the procedure room.                        - ASA Grade Assessment: III - A patient with severe                         systemic disease.                        - After reviewing the risks and benefits, the patient                         was deemed in satisfactory condition to undergo the                         procedure.  After obtaining informed consent, the colonoscope was                         passed under direct vision. Throughout the procedure,                         the patient's blood pressure, pulse, and oxygen                         saturations were monitored  continuously. The                         Colonoscope was introduced through the anus and                         advanced to the the cecum, identified by appendiceal                         orifice and ileocecal valve. The colonoscopy was                         performed without difficulty. The patient tolerated                         the procedure well. The quality of the bowel                         preparation was good. The ileocecal valve, appendiceal                         orifice, and rectum were photographed. Findings:      The perianal and digital rectal examinations were normal. Pertinent       negatives include normal sphincter tone and no palpable rectal lesions.      Non-bleeding internal hemorrhoids were found during retroflexion. The       hemorrhoids were Grade I (internal hemorrhoids that do not prolapse).      Many medium-mouthed diverticula were found in the sigmoid colon.      Two sessile polyps were found in the proximal ascending colon. The       polyps were 3 to 4 mm in size. These polyps were removed with a jumbo       cold forceps. Resection and retrieval were complete.      The exam was otherwise without abnormality. Impression:            - Non-bleeding internal hemorrhoids.                        - Diverticulosis in the sigmoid colon.                        - Two 3 to 4 mm polyps in the proximal ascending                         colon, removed with a jumbo cold forceps. Resected and                         retrieved.                        -  The examination was otherwise normal. Recommendation:        - Patient has a contact number available for                         emergencies. The signs and symptoms of potential                         delayed complications were discussed with the patient.                         Return to normal activities tomorrow. Written                         discharge instructions were provided to the patient.                         - Resume previous diet.                        - Continue present medications.                        - If polyps are benign or adenomatous without                         dysplasia, I will advise NO further colonoscopy due to                         advanced age and/or severe comorbidity.                        - Return to GI office PRN.                        - The findings and recommendations were discussed with                         the patient. Procedure Code(s):     --- Professional ---                        [phone], Colonoscopy, flexible; with biopsy, single or                         multiple Diagnosis Code(s):     --- Professional ---                        K57.30, Diverticulosis of large intestine without                         perforation or abscess without bleeding                        Z86.010, Personal history of colonic polyps                        D12.2, Benign neoplasm of ascending colon                        K64.0, First degree hemorrhoids CPT copyright 2022 American [employer].  All rights reserved. The codes documented in this report are preliminary and upon coder review may  be revised to meet current compliance requirements. Efrain Sella MD, MD 09/21/2022 1:08:40 PM This report has been signed electronically. Number of Addenda: 0 Note Initiated On: 09/21/2022 12:22 PM Scope Withdrawal Time: 0 hours 7 minutes 14 seconds  Total Procedure Duration: 0 hours 11 minutes 4 seconds  Estimated Blood Loss:  Estimated blood loss: none. Estimated blood loss: none.      [facility]

## 2022-09-21 NOTE — Anesthesia Postprocedure Evaluation (Signed)
Anesthesia Post Note  Patient: Peter Rosario.  Procedure(s) Performed: COLONOSCOPY WITH PROPOFOL  Patient location during evaluation: PACU Anesthesia Type: General Level of consciousness: awake and alert Pain management: pain level controlled Vital Signs Assessment: post-procedure vital signs reviewed and stable Respiratory status: spontaneous breathing, nonlabored ventilation, respiratory function stable and patient connected to nasal cannula oxygen Cardiovascular status: blood pressure returned to baseline and stable Postop Assessment: no apparent nausea or vomiting Anesthetic complications: no   No notable events documented.   Last Vitals:  Vitals:   09/21/22 1320 09/21/22 1328  BP: 97/69 121/73  Pulse: 81 68  Resp: 16 19  Temp:    SpO2: 96% 97%    Last Pain:  Vitals:   09/21/22 1328  TempSrc:   PainSc: 0-No pain                 Molli Barrows

## 2022-09-21 NOTE — H&P (Signed)
Outpatient short stay form Pre-procedure 09/21/2022 11:51 AM Peter Rosario K. Peter Rosario, M.D.  Primary Physician: Emily Filbert, M.D.  Reason for visit:  Personal history of adenomatous and serrated colon polyps 06/20/2018 colonoscopy  History of present illness:                            Patient presents for colonoscopy for a personal hx of colon polyps. The patient denies abdominal pain, abnormal weight loss or rectal bleeding.      Current Facility-Administered Medications:    0.9 %  sodium chloride infusion, , Intravenous, Continuous, Kamiyah Kindel, Benay Pike, MD  Medications Prior to Admission  Medication Sig Dispense Refill Last Dose   albuterol (PROVENTIL HFA;VENTOLIN HFA) 108 (90 BASE) MCG/ACT inhaler Inhale 2 puffs into the lungs every 6 (six) hours as needed for wheezing or shortness of breath.   Past Week   aspirin 81 MG tablet Take 81 mg by mouth daily.   Past Week   levothyroxine (SYNTHROID, LEVOTHROID) 137 MCG tablet Take 137 mcg by mouth daily before breakfast.   09/21/2022 at 0600   levothyroxine (SYNTHROID, LEVOTHROID) 150 MCG tablet Take 150 mcg by mouth daily before breakfast.   09/21/2022 at 0600   lisinopril-hydrochlorothiazide (PRINZIDE,ZESTORETIC) 20-12.5 MG per tablet Take 1 tablet by mouth daily.   09/21/2022 at 0600   Misc Natural Products (GLUCOSAMINE CHOND COMPLEX/MSM PO) Take by mouth daily.   Past Week   Multiple Vitamin (MULTIVITAMIN) tablet Take 1 tablet by mouth daily.   Past Week   pantoprazole (PROTONIX) 40 MG tablet Take 40 mg by mouth daily.   Past Week   thiamine 250 MG tablet Take 250 mg by mouth daily.   Past Week   vitamin C (ASCORBIC ACID) 500 MG tablet Take 500 mg by mouth 2 (two) times daily.   Past Week     No Known Allergies   Past Medical History:  Diagnosis Date   Benign neoplasm of colon    Diverticulosis    Esophagitis    Esophagitis, reflux 10/09/2014   History of colon polyps    Hypertension    Hypothyroidism 10/09/2014   Melanoma ([place])     Melanoma ([place])    right arm     Review of systems:  Otherwise negative.    Physical Exam  Gen: Alert, oriented. Appears stated age.  HEENT: [place]/AT. PERRLA. Lungs: CTA, no wheezes. CV: RR nl S1, S2. Abd: soft, benign, no masses. BS+ Ext: No edema. Pulses 2+    Planned procedures: Proceed with colonoscopy. The patient understands the nature of the planned procedure, indications, risks, alternatives and potential complications including but not limited to bleeding, infection, perforation, damage to internal organs and possible oversedation/side effects from anesthesia. The patient agrees and gives consent to proceed.  Please refer to procedure notes for findings, recommendations and patient disposition/instructions.     Ramondo Dietze K. Peter Rosario, M.D. Gastroenterology 09/21/2022  11:51 AM

## 2022-09-21 NOTE — Interval H&P Note (Signed)
History and Physical Interval Note:  09/21/2022 11:52 AM  Peter Rosario.  has presented today for surgery, with the diagnosis of h/o ta polyps.  The various methods of treatment have been discussed with the patient and family. After consideration of risks, benefits and other options for treatment, the patient has consented to  Procedure(s): COLONOSCOPY WITH PROPOFOL (N/A) as a surgical intervention.  The patient's history has been reviewed, patient examined, no change in status, stable for surgery.  I have reviewed the patient's chart and labs.  Questions were answered to the patient's satisfaction.     [place], [place]

## 2022-09-21 NOTE — Anesthesia Preprocedure Evaluation (Addendum)
Anesthesia Evaluation  Patient identified by MRN, date of birth, ID band Patient awake    Reviewed: Allergy & Precautions, H&P , NPO status , Patient's Chart, lab work & pertinent test results, reviewed documented beta blocker date and time   Airway Mallampati: II   Neck ROM: full    Dental  (+) Poor Dentition   Pulmonary neg pulmonary ROS   Pulmonary exam normal        Cardiovascular Exercise Tolerance: Good hypertension, On Medications negative cardio ROS Normal cardiovascular exam Rhythm:regular Rate:Normal     Neuro/Psych negative neurological ROS  negative psych ROS   GI/Hepatic negative GI ROS, Neg liver ROS,,,  Endo/Other  Hypothyroidism    Renal/GU negative Renal ROS  negative genitourinary   Musculoskeletal   Abdominal   Peds  Hematology negative hematology ROS (+)   Anesthesia Other Findings Past Medical History: No date: Benign neoplasm of colon No date: Diverticulosis No date: Esophagitis 10/09/2014: Esophagitis, reflux No date: History of colon polyps No date: Hypertension 10/09/2014: Hypothyroidism No date: Melanoma ([place]) No date: Melanoma ([place])     Comment:  right arm  Past Surgical History: 04/27/2015: COLONOSCOPY; N/A     Comment:  Procedure: COLONOSCOPY;  Surgeon: Manya Silvas, MD;               Location: [facility];  Service: Endoscopy;                Laterality: N/A; 04/27/2015: COLONOSCOPY 06/20/2018: COLONOSCOPY WITH PROPOFOL; N/A     Comment:  Procedure: COLONOSCOPY WITH PROPOFOL;  Surgeon: Manya Silvas, MD;  Location: [facility] ENDOSCOPY;  Service:               Endoscopy;  Laterality: N/A; No date: Diverticular/SBO surgery 03/28/2007: ESOPHAGOGASTRODUODENOSCOPY No date: GOITER     Comment:  LARGE THYROID GOITER REMOVED No date: NASAL/SINUS ENDOSCOPY BMI    Body Mass Index: 27.25 kg/m     Reproductive/Obstetrics negative OB ROS                              Anesthesia Physical Anesthesia Plan  ASA: 2  Anesthesia Plan: General   Post-op Pain Management:    Induction:   PONV Risk Score and Plan:   Airway Management Planned:   Additional Equipment:   Intra-op Plan:   Post-operative Plan:   Informed Consent: I have reviewed the patients History and Physical, chart, labs and discussed the procedure including the risks, benefits and alternatives for the proposed anesthesia with the patient or authorized representative who has indicated his/her understanding and acceptance.     Dental Advisory Given  Plan Discussed with: CRNA  Anesthesia Plan Comments:        Anesthesia Quick Evaluation

## 2022-09-22 ENCOUNTER — Encounter: Payer: Self-pay | Admitting: Internal Medicine

## 2022-09-22 LAB — SURGICAL PATHOLOGY
